# Patient Record
Sex: Male | Born: 1935 | Race: White | Hispanic: No | Marital: Married | Smoking: Never smoker
Health system: Southern US, Community
[De-identification: ages and names within clinical notes are randomized; demographics above are authoritative.]

## PROBLEM LIST (undated history)

## (undated) DIAGNOSIS — R609 Edema, unspecified: Secondary | ICD-10-CM

---

## 2015-04-12 ENCOUNTER — Telehealth: Payer: Self-pay | Admitting: Family Medicine

## 2015-04-12 NOTE — Telephone Encounter (Signed)
Patient sustained a fall yesterday on his deck and has a skin tear to his arm. His neighbor, who is a Engineer, civil (consulting), dressed the wound last night. He doesn't have a primary care provider and was most recently treated at [facility] for a cat scratch.  Explained that there are no new patient appointments available for today. Suggested he seek care at the Urgent Care since they have his up to date information. He is welcome to schedule a a new patient appointment to establish care in the near future if he wishes.  Patient's wife stated understanding and was agreeable to the plan.

## 2016-03-27 ENCOUNTER — Emergency Department (HOSPITAL_COMMUNITY): Payer: Medicare Other

## 2016-03-27 ENCOUNTER — Encounter (HOSPITAL_COMMUNITY): Payer: Self-pay

## 2016-03-27 ENCOUNTER — Emergency Department (HOSPITAL_COMMUNITY)
Admission: EM | Admit: 2016-03-27 | Discharge: 2016-03-27 | Disposition: A | Discharge status: Home / Self Care | Payer: Medicare Other | Attending: Emergency Medicine | Admitting: Emergency Medicine

## 2016-03-27 DIAGNOSIS — Z79899 Other long term (current) drug therapy: Secondary | ICD-10-CM | POA: Insufficient documentation

## 2016-03-27 DIAGNOSIS — M79662 Pain in left lower leg: Secondary | ICD-10-CM | POA: Diagnosis present

## 2016-03-27 DIAGNOSIS — R600 Localized edema: Secondary | ICD-10-CM | POA: Insufficient documentation

## 2016-03-27 DIAGNOSIS — M79605 Pain in left leg: Secondary | ICD-10-CM

## 2016-03-27 HISTORY — DX: Edema, unspecified: R60.9

## 2016-03-27 LAB — CBC WITH DIFFERENTIAL/PLATELET
BASOS ABS: 0 10*3/uL (ref 0.0–0.1)
BASOS PCT: 0 %
EOS ABS: 0 10*3/uL (ref 0.0–0.7)
EOS PCT: 1 %
HEMATOCRIT: 31.8 % — AB (ref 39.0–52.0)
Hemoglobin: 11.1 g/dL — ABNORMAL LOW (ref 13.0–17.0)
LYMPHS ABS: 0.3 10*3/uL — AB (ref 0.7–4.0)
Lymphocytes Relative: 6 %
MCH: 36.8 pg — ABNORMAL HIGH (ref 26.0–34.0)
MCHC: 34.9 g/dL (ref 30.0–36.0)
MCV: 105.3 fL — ABNORMAL HIGH (ref 78.0–100.0)
Monocytes Absolute: 0.7 10*3/uL (ref 0.1–1.0)
Monocytes Relative: 12 %
Neutro Abs: 4.8 10*3/uL (ref 1.7–7.7)
Neutrophils Relative %: 81 %
PLATELETS: 71 10*3/uL — AB (ref 150–400)
RBC: 3.02 MIL/uL — AB (ref 4.22–5.81)
RDW: 16.9 % — AB (ref 11.5–15.5)
Smear Review: DECREASED
WBC: 5.8 10*3/uL (ref 4.0–10.5)

## 2016-03-27 LAB — COMPREHENSIVE METABOLIC PANEL
ALBUMIN: 3.2 g/dL — AB (ref 3.5–5.0)
ALT: 17 U/L (ref 17–63)
ANION GAP: 5 (ref 5–15)
AST: 40 U/L (ref 15–41)
Alkaline Phosphatase: 64 U/L (ref 38–126)
BILIRUBIN TOTAL: 2.2 mg/dL — AB (ref 0.3–1.2)
BUN: 18 mg/dL (ref 6–20)
CALCIUM: 8.8 mg/dL — AB (ref 8.9–10.3)
CO2: 27 mmol/L (ref 22–32)
CREATININE: 1.48 mg/dL — AB (ref 0.61–1.24)
Chloride: 102 mmol/L (ref 101–111)
GFR calc Af Amer: 48 mL/min — ABNORMAL LOW (ref >60)
GFR calc non Af Amer: 41 mL/min — ABNORMAL LOW (ref >60)
GLUCOSE: 115 mg/dL — AB (ref 65–99)
Potassium: 3.9 mmol/L (ref 3.5–5.1)
Sodium: 134 mmol/L — ABNORMAL LOW (ref 135–145)
TOTAL PROTEIN: 6.3 g/dL — AB (ref 6.5–8.1)

## 2016-03-27 MED ORDER — TRAMADOL HCL 50 MG PO TABS
ORAL_TABLET | ORAL | Status: AC
  Filled 2016-03-27: qty 1

## 2016-03-27 MED ORDER — TRAMADOL HCL 50 MG PO TABS: 50.0000 mg | ORAL_TABLET | Freq: Four times a day (QID) | ORAL | Status: AC | PRN

## 2016-03-27 MED ORDER — TRAMADOL HCL 50 MG PO TABS
50.0000 mg | ORAL_TABLET | Freq: Once | ORAL | Status: AC
  Administered 2016-03-27: 50 mg via ORAL

## 2016-03-27 NOTE — ED Notes (Signed)
Pt states he takes a bunch of medication for edema

## 2016-03-27 NOTE — ED Notes (Signed)
Complain of cramping to both legs that started around 1500 today

## 2016-03-27 NOTE — ED Provider Notes (Signed)
CSN: [id number]     Arrival date & time 03/27/16  1478 History   First MD Initiated Contact with Patient 03/27/16 1956     Chief Complaint  Patient presents with  . Leg Pain     (Consider location/radiation/quality/duration/timing/severity/associated sxs/prior Treatment) Patient is a 82 y.o. male presenting with leg pain. The history is provided by the patient (Patient complains of bilateral lower leg pain earlier today. No pain now).  Leg Pain Lower extremity pain location: Left and right calf. Injury: no   Pain details:    Quality:  Aching   Radiates to:  Does not radiate   Severity:  Moderate   Onset quality:  Sudden   Timing:  Constant   Progression:  Waxing and waning Associated symptoms: no back pain and no fatigue     Past Medical History  Diagnosis Date  . Edema    History reviewed. No pertinent past surgical history. No family history on file. Social History  Substance Use Topics  . Smoking status: Never Smoker   . Smokeless tobacco: None  . Alcohol Use: No    Review of Systems  Constitutional: Negative for appetite change and fatigue.  HENT: Negative for congestion, ear discharge and sinus pressure.   Eyes: Negative for discharge.  Respiratory: Negative for cough.   Cardiovascular: Negative for chest pain.  Gastrointestinal: Negative for abdominal pain and diarrhea.  Genitourinary: Negative for frequency and hematuria.  Musculoskeletal: Negative for back pain.       Leg pain  Skin: Negative for rash.  Neurological: Negative for seizures and headaches.  Psychiatric/Behavioral: Negative for hallucinations.      Allergies  Review of patient's allergies indicates no known allergies.  Home Medications   Prior to Admission medications   Medication Sig Start Date End Date Taking? Authorizing Provider  capecitabine (XELODA) 150 MG tablet Take 450 mg by mouth 2 (two) times daily.   Yes Historical Provider, MD  carvedilol (COREG) 12.5 MG tablet Take 12.5  mg by mouth 2 (two) times daily.   Yes Historical Provider, MD  Cholecalciferol (VITAMIN D3) 5000 units CAPS Take 1 capsule by mouth daily.   Yes Historical Provider, MD  ferrous sulfate 325 (65 FE) MG tablet Take 325 mg by mouth 2 (two) times daily.   Yes Historical Provider, MD  furosemide (LASIX) 20 MG tablet Take 20 mg by mouth daily.   Yes Historical Provider, MD  omeprazole (PRILOSEC) 20 MG capsule Take 20 mg by mouth daily.   Yes Historical Provider, MD  Pyridoxine HCl (B-6 PO) Take 1 tablet by mouth daily.   Yes Historical Provider, MD  spironolactone (ALDACTONE) 25 MG tablet Take 25 mg by mouth daily.   Yes Historical Provider, MD   BP 105/56 mmHg  Pulse 68  Temp(Src) 98.5 F (36.9 C) (Oral)  Resp 18  Ht  (1.803 m)  Wt 172 lb (78.019 kg)  BMI 24.00 kg/m2  SpO2 99% Physical Exam  Constitutional: He is oriented to person, place, and time. He appears well-developed.  HENT:  Head: Normocephalic.  Eyes: Conjunctivae and EOM are normal. No scleral icterus.  Neck: Neck supple. No thyromegaly present.  Cardiovascular: Normal rate and regular rhythm.  Exam reveals no gallop and no friction rub.   No murmur heard. Pulmonary/Chest: No stridor. He has no wheezes. He has no rales. He exhibits no tenderness.  Abdominal: He exhibits no distension. There is no tenderness. There is no rebound.  Musculoskeletal: Normal range of motion. He exhibits edema.  Edema in both legs  Lymphadenopathy:    He has no cervical adenopathy.  Neurological: He is oriented to person, place, and time. He exhibits normal muscle tone. Coordination normal.  Skin: No rash noted. No erythema.  Psychiatric: He has a normal mood and affect. His behavior is normal.    ED Course  Procedures (including critical care time) Labs Review Labs Reviewed  CBC WITH DIFFERENTIAL/PLATELET - Abnormal; Notable for the following:    RBC 3.02 (*)    Hemoglobin 11.1 (*)    HCT 31.8 (*)    MCV 105.3 (*)    MCH 36.8 (*)     RDW 16.9 (*)    Platelets 71 (*)    Lymphs Abs 0.3 (*)    All other components within normal limits  COMPREHENSIVE METABOLIC PANEL - Abnormal; Notable for the following:    Sodium 134 (*)    Glucose, Bld 115 (*)    Creatinine, Ser 1.48 (*)    Calcium 8.8 (*)    Total Protein 6.3 (*)    Albumin 3.2 (*)    Total Bilirubin 2.2 (*)    GFR calc non Af Amer 41 (*)    GFR calc Af Amer 48 (*)    All other components within normal limits    Imaging Review No results found. I have personally reviewed and evaluated these images and lab results as part of my medical decision-making.   EKG Interpretation None      MDM   Final diagnoses:  None    A family member pulled me aside and told me that [facility] had diagnosed [facility] with colon cancer and cirrhosis.  Plain films of the hip shows arthritic changes. Patient will be sent home with some Ultram to help with his hip pain and bilateral periodic calf pain  Bethann BerkshireJoseph Maeson Purohit, MD 03/27/16 2144

## 2016-03-27 NOTE — Discharge Instructions (Signed)
Follow up with your md as needed °

## 2016-07-19 DEATH — deceased

## 2017-07-02 IMAGING — DX DG HIP (WITH OR WITHOUT PELVIS) 2-3V*L*
3 series · 3 of 3 positions shown · non-contrast
Comparison: None.

CLINICAL DATA: Left hip pain and difficulty walking today. No known
injury. Initial encounter.

EXAM:
DG HIP (WITH OR WITHOUT PELVIS) 2-3V LEFT

[pelvis ap]
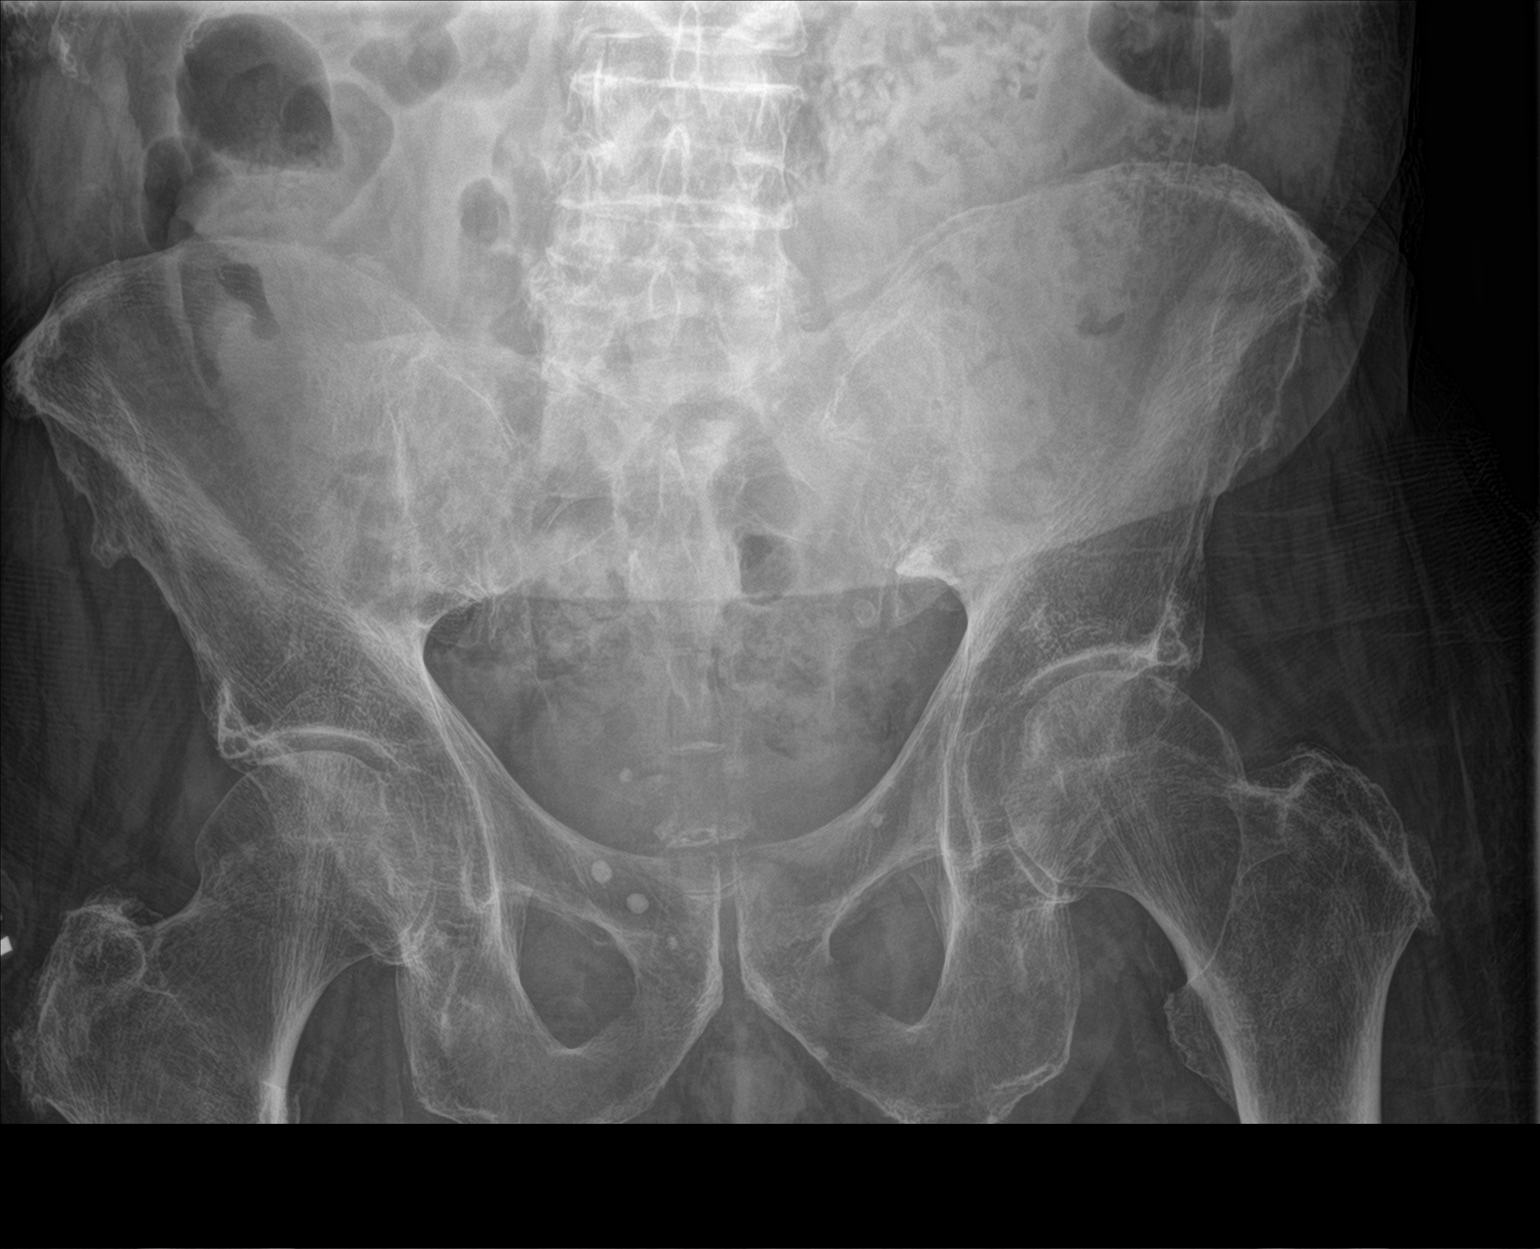

[hip ap]
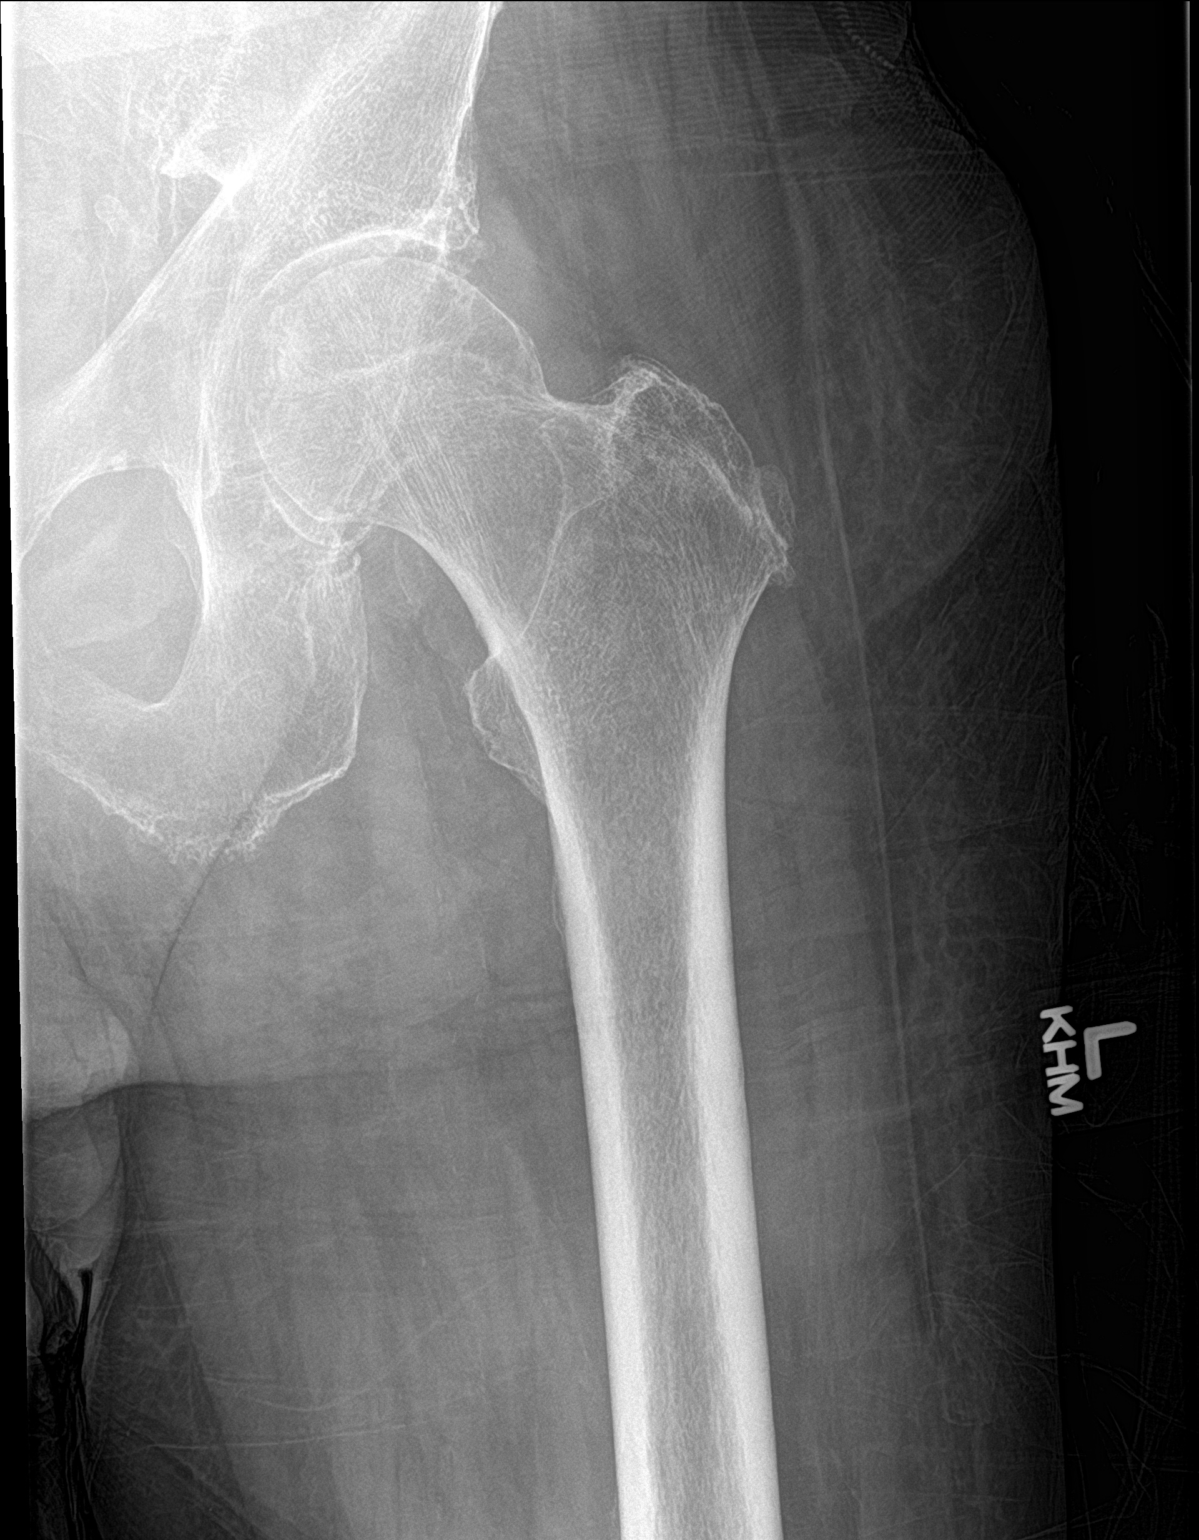

[hip lat]
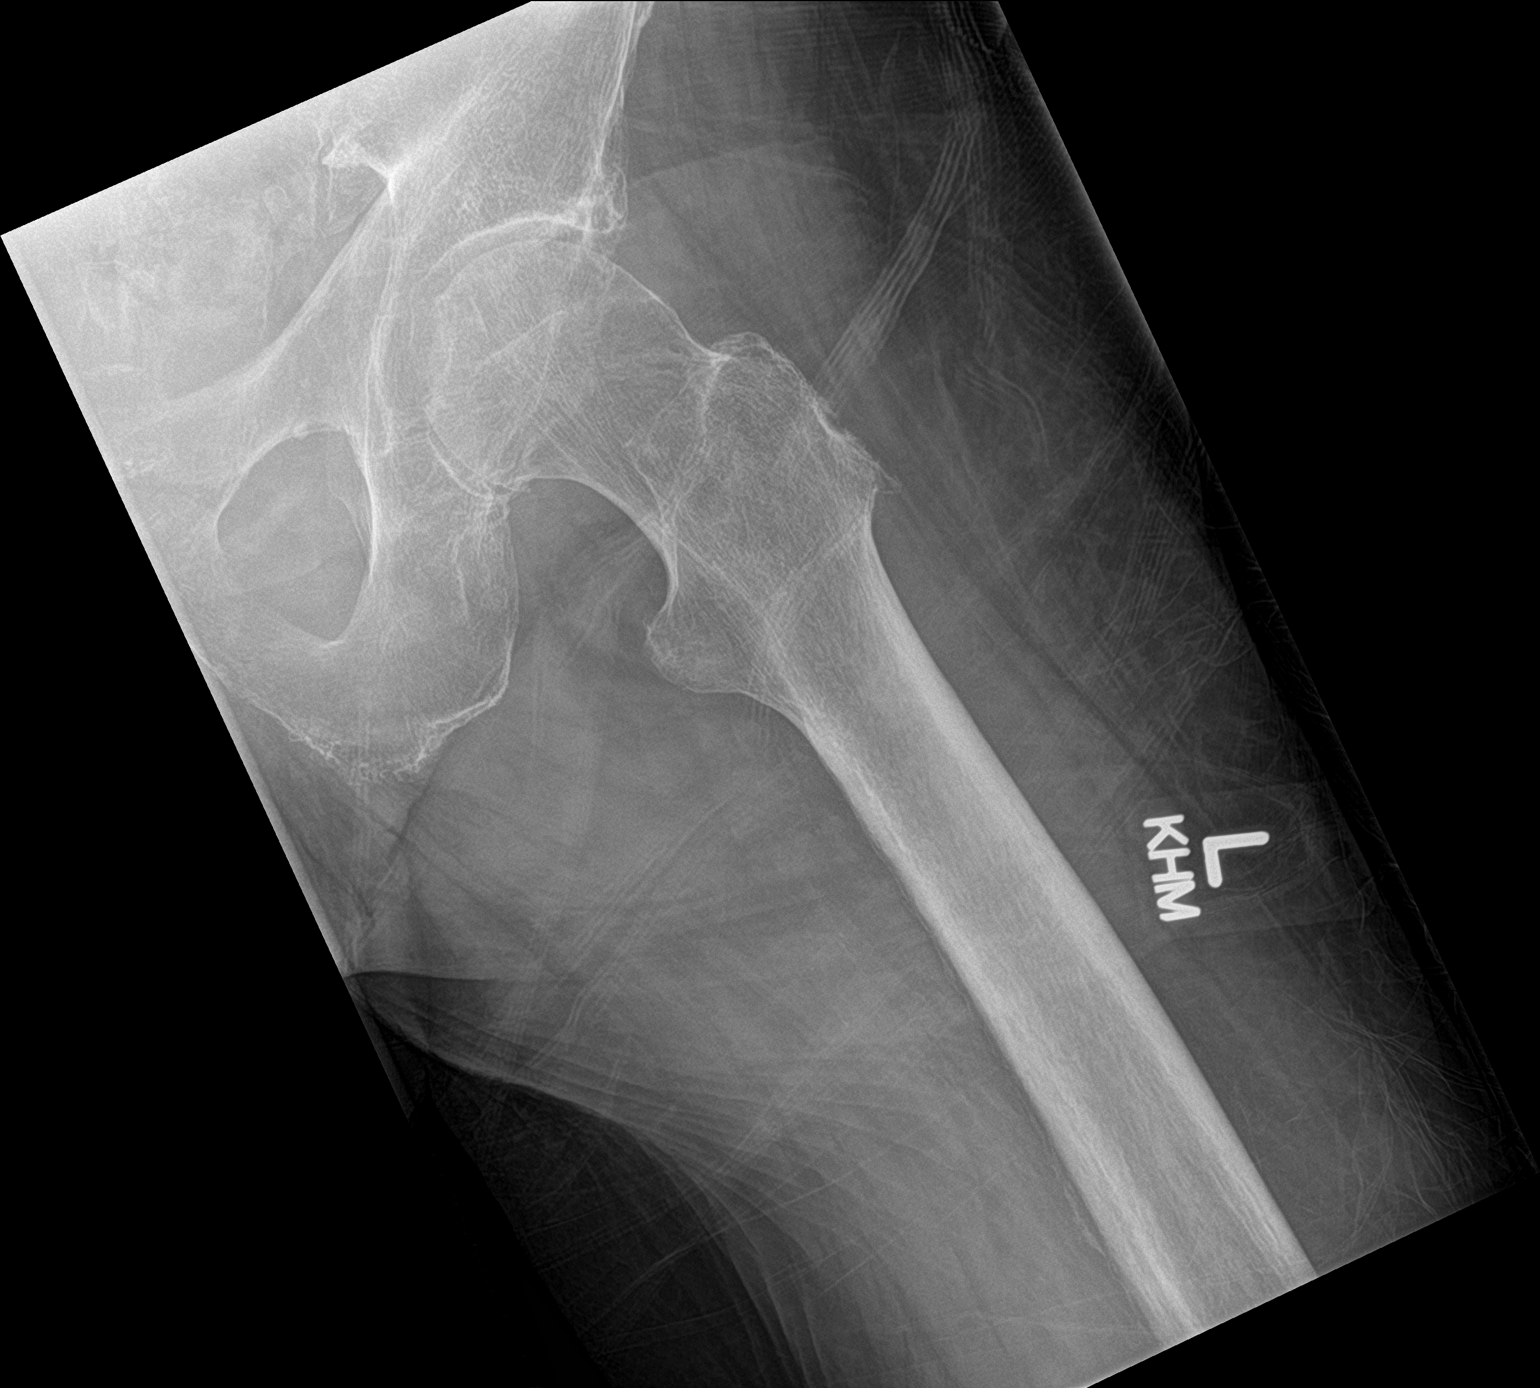

[3 of 3 positions shown; findings below may reference images not displayed]

FINDINGS: No acute bony or joint abnormality is seen. Mild to moderate
degenerative change about the hips appears symmetric. No focal bony
lesion is identified.
IMPRESSION: No acute abnormality.

Mild to moderate bilateral hip degenerative change.
